# Patient Record
Sex: Male | Born: 1995 | Race: Black or African American | Hispanic: No | Marital: Single | Smoking: Never smoker
Health system: Southern US, Community
[De-identification: ages and names within clinical notes are randomized; demographics above are authoritative.]

## PROBLEM LIST (undated history)

## (undated) DIAGNOSIS — J302 Other seasonal allergic rhinitis: Secondary | ICD-10-CM

## (undated) DIAGNOSIS — F419 Anxiety disorder, unspecified: Secondary | ICD-10-CM

## (undated) HISTORY — DX: Other seasonal allergic rhinitis: J30.2

## (undated) HISTORY — DX: Anxiety disorder, unspecified: F41.9

---
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
## 2018-01-07 ENCOUNTER — Ambulatory Visit (INDEPENDENT_AMBULATORY_CARE_PROVIDER_SITE_OTHER): Payer: BLUE CROSS/BLUE SHIELD | Admitting: Physician Assistant

## 2018-01-07 ENCOUNTER — Encounter: Payer: Self-pay | Admitting: Physician Assistant

## 2018-01-07 VITALS — BP 132/92 | HR 71 | Temp 97.7°F | Resp 20 | Ht 69.25 in | Wt 165.0 lb

## 2018-01-07 DIAGNOSIS — F411 Generalized anxiety disorder: Secondary | ICD-10-CM

## 2018-01-07 DIAGNOSIS — Z0000 Encounter for general adult medical examination without abnormal findings: Secondary | ICD-10-CM | POA: Diagnosis not present

## 2018-01-07 NOTE — Progress Notes (Signed)
    Patient ID: Tanner Tanner MRN: [id number], DOB: 08/14/1995, 22 y.o. Date of Encounter: 01/07/2018, 8:59 AM    Chief Complaint:  Chief Complaint  Patient presents with  . New Patient (Initial Visit)     HPI: 22 y.o. year old male presents as a New Patient to Establish Care.  Reviewed his chart.  At prior note in chart he was playing football at [employer].  That note documented that he was having some anxiety and they had prescribed BuSpar.  Today when I enter the room he states that he goes by Tanner Tanner.  Says that I can call him Tanner Tanner. He states that he has had some problems with anxiety.  States that in the past he was prescribed BuSpar and was taking it as needed but did not feel like it helped.  Says that he had gotten a therapy dog and did feel like the therapy dog helped---so then he was able to just continue to focus on that and then did not really need the medicine much. He states that he recently graduated.   [place].   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   Says that he may go back to school to do occupational therapy or something like that. States that his anxiety mostly shows up when there is increased stressors.   Says that when he had to get on medicine in the past-- was when he had lost a family member and also was having exams in college. States that his apartment in college had required him to fill out a form regarding his therapy dog.  States his current apartment is needing updated documentation.  He has no other specific concerns to address.  Discussed, recommended flu vaccine but he defers this.     Home Meds:   No outpatient medications prior to visit.   No facility-administered medications prior to visit.     Allergies: No Known Allergies    Review of Systems: See HPI for pertinent ROS. All other ROS negative.    Physical Exam: Blood pressure (!) 132/92, pulse 71, temperature  97.7 F (36.5 C), temperature source Oral, resp. rate 20, height 5' 9.25" (1.759 m), weight 74.8 kg, SpO2 98 %., Body mass index is 24.19 kg/m. General:  WNWD AAM. Appears in no acute distress. Neck: Supple. No thyromegaly. No lymphadenopathy. Lungs: Clear bilaterally to auscultation without wheezes, rales, or rhonchi. Breathing is unlabored. Heart: Regular rhythm. No murmurs, rubs, or gallops. Msk:  Strength and tone normal for age. Extremities/Skin: Warm and dry.  Neuro: Alert and oriented X 3. Moves all extremities spontaneously. Gait is normal. CNII-XII grossly in tact. Psych:  Responds to questions appropriately with a normal affect.     ASSESSMENT AND PLAN:  22 y.o. year old male with   1. Encounter for medical examination to establish care  2. Generalized anxiety disorder He reports that this is currently stable without medication. Feels that his therapy dog is hoping to keep this controlled.   Signed, [address]Mary Beth PutneyDixon, [place], [facility] 01/07/2018 8:59 AM

## 2018-11-08 ENCOUNTER — Other Ambulatory Visit: Payer: Self-pay | Admitting: Physician Assistant

## 2018-11-08 DIAGNOSIS — Z20822 Contact with and (suspected) exposure to covid-19: Secondary | ICD-10-CM

## 2018-11-11 LAB — NOVEL CORONAVIRUS, NAA: SARS-CoV-2, NAA: NOT DETECTED

## 2019-07-27 ENCOUNTER — Ambulatory Visit: Payer: Self-pay | Attending: Internal Medicine

## 2019-08-15 ENCOUNTER — Ambulatory Visit: Payer: Self-pay | Attending: Internal Medicine

## 2019-09-05 ENCOUNTER — Ambulatory Visit: Payer: Self-pay | Attending: Internal Medicine

## 2019-09-05 DIAGNOSIS — Z23 Encounter for immunization: Secondary | ICD-10-CM

## 2019-09-05 NOTE — Progress Notes (Signed)
   [id number]  Name:  Tanner Tanner.    MRN: [id number] DOB: 10/08/1995  09/05/2019  [name] was observed post Covid-19 immunization for 15 minutes without incident. He was provided with Vaccine Information Sheet and instruction to access the V-Safe system.   [name] was instructed to call 911 with any severe reactions post vaccine: Marland Kitchen Difficulty breathing  . Swelling of face and throat  . A fast heartbeat  . A bad rash all over body  . Dizziness and weakness   Immunizations Administered    Name Date Dose VIS Date Route   Pfizer COVID-19 Vaccine 09/05/2019  9:43 AM 0.3 mL 06/15/2018 Intramuscular   Manufacturer: [employer], [employer]   Lot: [id number]   NDC: [address]-6

## 2019-09-26 ENCOUNTER — Ambulatory Visit: Payer: Self-pay | Attending: Internal Medicine

## 2019-09-26 DIAGNOSIS — Z23 Encounter for immunization: Secondary | ICD-10-CM

## 2019-09-26 NOTE — Progress Notes (Signed)
   [id number]  Name:  Tanner Tanner.    MRN: [id number] DOB: April 27, 1995  09/26/2019  Mr. [name] was observed post Covid-19 immunization for 15 minutes without incident. He was provided with Vaccine Information Sheet and instruction to access the V-Safe system.   Mr. [name] was instructed to call 911 with any severe reactions post vaccine: Marland Kitchen Difficulty breathing  . Swelling of face and throat  . A fast heartbeat  . A bad rash all over body  . Dizziness and weakness   Immunizations Administered    Name Date Dose VIS Date Route   Pfizer COVID-19 Vaccine 09/26/2019 11:08 AM 0.3 mL 06/15/2018 Intramuscular   Manufacturer: [employer], [employer]   Lot: [id number]   NDC: [address]-5

## 2020-03-31 ENCOUNTER — Ambulatory Visit
Admission: RE | Admit: 2020-03-31 | Discharge: 2020-03-31 | Disposition: A | Discharge status: Home / Self Care | Payer: Managed Care, Other (non HMO) | Source: Ambulatory Visit | Attending: Emergency Medicine | Admitting: Emergency Medicine

## 2020-03-31 ENCOUNTER — Other Ambulatory Visit: Payer: Self-pay

## 2020-03-31 VITALS — BP 129/83 | HR 99 | Temp 98.5°F | Resp 18

## 2020-03-31 DIAGNOSIS — J069 Acute upper respiratory infection, unspecified: Secondary | ICD-10-CM

## 2020-03-31 MED ORDER — GUAIFENESIN-CODEINE 100-10 MG/5ML PO SOLN: 10.0000 mL | Freq: Every evening | ORAL | 0 refills | Status: AC | PRN

## 2020-03-31 MED ORDER — BENZONATATE 200 MG PO CAPS: 200.0000 mg | ORAL_CAPSULE | Freq: Three times a day (TID) | ORAL | 0 refills | Status: AC | PRN

## 2020-03-31 NOTE — ED Triage Notes (Signed)
Pt here for URI sx x 3 days with negative covid test; pt denies fever

## 2020-03-31 NOTE — ED Provider Notes (Signed)
[facility]    CSN: [id number] Arrival date & time: 03/31/20  1146      History   Chief Complaint Chief Complaint  Patient presents with  . Appointment    1200  . URI    HPI Tanner Tanner. is a 24 y.o. male presenting today for evaluation of URI symptoms.  Reports headache began Tuesday, approximately 5 days ago.  Over the past few days he has had more cough and congestion and sore throat chills and body aches.  Cough waking up out of sleep.  Denies any fevers.  Previously had Covid test which was negative.  Mild nausea and slightly looser stools, but denies any significant GI symptoms or abdominal pain.  Using Mucinex and Zyrtec.  HPI  Past Medical History:  Diagnosis Date  . Anxiety   . Seasonal allergies     Patient Active Problem List   Diagnosis Date Noted  . Generalized anxiety disorder 01/07/2018    History reviewed. No pertinent surgical history.     Home Medications    Prior to Admission medications   Medication Sig Start Date End Date Taking? Authorizing Provider  benzonatate (TESSALON) 200 MG capsule Take 1 capsule (200 mg total) by mouth 3 (three) times daily as needed for up to 7 days for cough. 03/31/20 04/07/20  Mataio Mele C, PA-C  guaiFENesin-codeine 100-10 MG/5ML syrup Take 10 mLs by mouth at bedtime as needed for cough. 03/31/20   Lanisa Ishler, Junius Creamer, PA-C    Family History Family History  Problem Relation Age of Onset  . Arthritis Maternal Aunt   . Drug abuse Maternal Aunt   . Anxiety disorder Maternal Uncle   . Arthritis Maternal Uncle   . Drug abuse Maternal Uncle   . Arthritis Paternal Aunt   . Anxiety disorder Paternal Uncle   . Arthritis Paternal Uncle   . Hypertension Maternal Grandmother   . Kidney disease Maternal Grandmother   . Arthritis Maternal Grandfather   . Cancer Paternal Grandmother   . Cancer Paternal Grandfather   . COPD Paternal Grandfather   . Heart disease Paternal Grandfather      Social History Social History   Tobacco Use  . Smoking status: Never Smoker  . Smokeless tobacco: Never Used  Vaping Use  . Vaping Use: Never used  Substance Use Topics  . Alcohol use: Never  . Drug use: Never     Allergies   Patient has no known allergies.   Review of Systems Review of Systems  Constitutional: Positive for chills and fatigue. Negative for activity change, appetite change and fever.  HENT: Positive for congestion, rhinorrhea, sinus pressure and sore throat. Negative for ear pain and trouble swallowing.   Eyes: Negative for discharge and redness.  Respiratory: Positive for cough. Negative for chest tightness and shortness of breath.   Cardiovascular: Negative for chest pain.  Gastrointestinal: Negative for abdominal pain, diarrhea, nausea and vomiting.  Musculoskeletal: Negative for myalgias.  Skin: Negative for rash.  Neurological: Positive for headaches. Negative for dizziness and light-headedness.     Physical Exam Triage Vital Signs ED Triage Vitals [03/31/20 1226]  Enc Vitals Group     BP 129/83     Pulse Rate 99     Resp 18     Temp 98.5 F (36.9 C)     Temp Source Oral     SpO2 98 %     Weight      Height      Head Circumference  Peak Flow      Pain Score 3     Pain Loc      Pain Edu?      Excl. in GC?    No data found.  Updated Vital Signs BP 129/83 (BP Location: Left Arm)   Pulse 99   Temp 98.5 F (36.9 C) (Oral)   Resp 18   SpO2 98%   Visual Acuity Right Eye Distance:   Left Eye Distance:   Bilateral Distance:    Right Eye Near:   Left Eye Near:    Bilateral Near:     Physical Exam Vitals and nursing note reviewed.  Constitutional:      Appearance: He is well-developed and well-nourished.     Comments: No acute distress  HENT:     Head: Normocephalic and atraumatic.     Ears:     Comments: Bilateral ears without tenderness to palpation of external auricle, tragus and mastoid, EAC's without erythema or  swelling, TM's with good bony landmarks and cone of light. Non erythematous.     Nose: Nose normal.     Mouth/Throat:     Comments: Oral mucosa pink and moist, no tonsillar enlargement or exudate. Posterior pharynx patent and nonerythematous, no uvula deviation or swelling. Normal phonation. Eyes:     Conjunctiva/sclera: Conjunctivae normal.  Cardiovascular:     Rate and Rhythm: Normal rate.  Pulmonary:     Effort: Pulmonary effort is normal. No respiratory distress.     Comments: Breathing comfortably at rest, CTABL, no wheezing, rales or other adventitious sounds auscultated Abdominal:     General: There is no distension.  Musculoskeletal:        General: Normal range of motion.     Cervical back: Neck supple.  Skin:    General: Skin is warm and dry.  Neurological:     Mental Status: He is alert and oriented to person, place, and time.  Psychiatric:        Mood and Affect: Mood and affect normal.      UC Treatments / Results  Labs (all labs ordered are listed, but only abnormal results are displayed) Labs Reviewed - No data to display  EKG   Radiology No results found.  Procedures Procedures (including critical care time)  Medications Ordered in UC Medications - No data to display  Initial Impression / Assessment and Plan / UC Course  I have reviewed the triage vital signs and the nursing notes.  Pertinent labs & imaging results that were available during my care of the patient were reviewed by me and considered in my medical decision making (see chart for details).     Viral URI with cough x5 days-exam reassuring, lungs clear, prior testing for Covid negative.  Recommending continued symptomatic and supportive care and close monitoring.  Push fluids.  Discussed strict return precautions. Patient verbalized understanding and is agreeable with plan.  Final Clinical Impressions(s) / UC Diagnoses   Final diagnoses:  Viral URI with cough     Discharge  Instructions     Rest and fluids Tylenol and ibuprofen as needed for any body aches, headaches Continue Zyrtec, Mucinex May use Tessalon/benzonatate every 8 hours for cough during the day Robitussin with codeine for nighttime cough-do not drive or work after taking Please follow-up if any symptoms not improving or worsening    ED Prescriptions    Medication Sig Dispense Auth. Provider   benzonatate (TESSALON) 200 MG capsule Take 1 capsule (200 mg total) by  mouth 3 (three) times daily as needed for up to 7 days for cough. 28 capsule Zyair Rhein C, PA-C   guaiFENesin-codeine 100-10 MG/5ML syrup Take 10 mLs by mouth at bedtime as needed for cough. 100 mL Doniven Vanpatten, [place] C, PA-C     PDMP not reviewed this encounter.   Lew Dawes, PA-C 03/31/20 1244

## 2020-03-31 NOTE — Discharge Instructions (Signed)
Rest and fluids Tylenol and ibuprofen as needed for any body aches, headaches Continue Zyrtec, Mucinex May use Tessalon/benzonatate every 8 hours for cough during the day Robitussin with codeine for nighttime cough-do not drive or work after taking Please follow-up if any symptoms not improving or worsening
